# Patient Record
Sex: Male | Born: 1955 | Race: Black or African American | Hispanic: No | Smoking: Current every day smoker
Health system: Southern US, Community
[De-identification: ages and names within clinical notes are randomized; demographics above are authoritative.]

## PROBLEM LIST (undated history)

## (undated) HISTORY — PX: COLON SURGERY: SHX602

## (undated) HISTORY — PX: CHOLECYSTECTOMY: SHX55

---

## 2014-09-17 ENCOUNTER — Encounter (HOSPITAL_COMMUNITY): Payer: Self-pay | Admitting: Emergency Medicine

## 2014-09-17 ENCOUNTER — Emergency Department (HOSPITAL_COMMUNITY)
Admission: EM | Admit: 2014-09-17 | Discharge: 2014-09-17 | Disposition: A | Discharge status: Home / Self Care | Payer: Self-pay | Attending: Emergency Medicine | Admitting: Emergency Medicine

## 2014-09-17 DIAGNOSIS — Z720 Tobacco use: Secondary | ICD-10-CM | POA: Insufficient documentation

## 2014-09-17 DIAGNOSIS — Y990 Civilian activity done for income or pay: Secondary | ICD-10-CM | POA: Insufficient documentation

## 2014-09-17 DIAGNOSIS — Z23 Encounter for immunization: Secondary | ICD-10-CM | POA: Insufficient documentation

## 2014-09-17 DIAGNOSIS — Y9389 Activity, other specified: Secondary | ICD-10-CM | POA: Insufficient documentation

## 2014-09-17 DIAGNOSIS — S61412A Laceration without foreign body of left hand, initial encounter: Secondary | ICD-10-CM | POA: Insufficient documentation

## 2014-09-17 DIAGNOSIS — W260XXA Contact with knife, initial encounter: Secondary | ICD-10-CM | POA: Insufficient documentation

## 2014-09-17 DIAGNOSIS — IMO0002 Reserved for concepts with insufficient information to code with codable children: Secondary | ICD-10-CM

## 2014-09-17 DIAGNOSIS — Y9289 Other specified places as the place of occurrence of the external cause: Secondary | ICD-10-CM | POA: Insufficient documentation

## 2014-09-17 MED ORDER — LIDOCAINE HCL 2 % IJ SOLN: 10.0000 mL | Freq: Once | INTRAMUSCULAR | Status: DC

## 2014-09-17 MED ORDER — BACITRACIN 500 UNIT/GM EX OINT
1.0000 "application " | TOPICAL_OINTMENT | Freq: Two times a day (BID) | CUTANEOUS | Status: DC
  Administered 2014-09-17: 1 via TOPICAL
  Filled 2014-09-17 (×2): qty 0.9

## 2014-09-17 MED ORDER — CEPHALEXIN 500 MG PO CAPS: 500.0000 mg | ORAL_CAPSULE | Freq: Four times a day (QID) | ORAL | Status: DC

## 2014-09-17 MED ORDER — LIDOCAINE-EPINEPHRINE (PF) 2 %-1:200000 IJ SOLN
10.0000 mL | Freq: Once | INTRAMUSCULAR | Status: AC
  Administered 2014-09-17: 10 mL
  Filled 2014-09-17: qty 20

## 2014-09-17 MED ORDER — BACITRACIN ZINC 500 UNIT/GM EX OINT: TOPICAL_OINTMENT | Freq: Two times a day (BID) | CUTANEOUS | Status: DC

## 2014-09-17 MED ORDER — TETANUS-DIPHTH-ACELL PERTUSSIS 5-2.5-18.5 LF-MCG/0.5 IM SUSP
0.5000 mL | Freq: Once | INTRAMUSCULAR | Status: AC
  Administered 2014-09-17: 0.5 mL via INTRAMUSCULAR
  Filled 2014-09-17: qty 0.5

## 2014-09-17 NOTE — ED Provider Notes (Signed)
CSN: [id number]     Arrival date & time 09/17/14  0915 History  This chart was scribed for non-physician practitioner, Joycie Peek, PA-C, working with Zadie Rhine, MD by Charline Bills, ED Scribe. This patient was seen in room TR06C/TR06C and the patient's care was started at 9:33 AM.   Chief Complaint  Patient presents with  . Extremity Laceration   The history is provided by the patient. No language interpreter was used.   HPI Comments: Stephen Herman is a 59 y.o. male, with no pertinent medical history, who presents to the Emergency Department complaining of a laceration to the palm of his L hand sustained last night while working. Pt states that he was cutting cabbage around 5 PM with a knife when unintentionally he cut his hand. He states that he washed his hand and applied a Band-Aid and glove following the accident to continue working. Pt currently rates his pain 5/10, worse with movement. He denies fever, numbness, decreased ROM. Pt thinks that his last tetanus was 3-5 years ago but he is unsure. No anticoagulation. Bleeding is controlled at this time. No other aggravating or modifying factors.  History reviewed. No pertinent past medical history. Past Surgical History  Procedure Laterality Date  . Cholecystectomy    . Colon surgery     No family history on file. History  Substance Use Topics  . Smoking status: Current Every Day Smoker -- 0.50 packs/day    Types: Cigarettes  . Smokeless tobacco: Not on file  . Alcohol Use: No    Review of Systems  Constitutional: Negative for fever.  Skin: Positive for wound.  Neurological: Negative for numbness.  Hematological: Does not bruise/bleed easily.   Allergies  Biaxin and Talwin  Home Medications   Prior to Admission medications   Medication Sig Start Date End Date Taking? Authorizing Provider  cephALEXin (KEFLEX) 500 MG capsule Take 1 capsule (500 mg total) by mouth 4 (four) times daily. 09/17/14   Rafeef Lau,  PA-C   BP 160/77 mmHg  Pulse 75  Temp(Src) 97.9 F (36.6 C) (Oral)  Resp 18  SpO2 100% Physical Exam  Constitutional: He is oriented to person, place, and time. He appears well-developed and well-nourished. No distress.  HENT:  Head: Normocephalic and atraumatic.  Eyes: Conjunctivae and EOM are normal.  Neck: Neck supple. No tracheal deviation present.  Cardiovascular: Normal rate, regular rhythm and normal heart sounds.   Pulmonary/Chest: Effort normal. No respiratory distress.  Musculoskeletal: Normal range of motion.  Neurological: He is alert and oriented to person, place, and time.  Skin: Skin is warm and dry. Laceration noted.  Palmar aspect of L hand 1.5 cm superficial laceration to inferior aspect of L thenar eminence. Maintains full active ROM of all digits. Hematosis achieved. NVI.   Psychiatric: He has a normal mood and affect. His behavior is normal.  Nursing note and vitals reviewed.  ED Course  Procedures (including critical care time) DIAGNOSTIC STUDIES: Oxygen Saturation is 100% on RA, normal by my interpretation.    COORDINATION OF CARE: 9:41 AM-Discussed treatment plan which includes sutures and tetanus with pt at bedside and pt agreed to plan.   LACERATION REPAIR PROCEDURE NOTE The patient's identification was confirmed and consent was obtained. This procedure was performed by Joycie Peek, PA-C at 10:10 AM. Site: L palm Sterile procedures observed Anesthetic used (type and amt): 1 mL lidocaine with epinephrine Suture type/size: 4 prolene Length: 1.5 cm # of Sutures: 2 Technique: simple interrupted  Complexity: complex Antibx  ointment applied Tetanus ordered Site anesthetized, irrigated with NS, explored without evidence of foreign body, wound well approximated, site covered with dry, sterile dressing.  Patient tolerated procedure well without complications. Instructions for care discussed verbally and patient provided with additional written  instructions for homecare and f/u.  Labs Review Labs Reviewed - No data to display  Imaging Review No results found.   EKG Interpretation None     Meds given in ED:  Medications  bacitracin ointment 1 application (1 application Topical Given 09/17/14 1024)  Tdap (BOOSTRIX) injection 0.5 mL (0.5 mLs Intramuscular Given 09/17/14 0958)  lidocaine-EPINEPHrine (XYLOCAINE W/EPI) 2 %-1:200000 (PF) injection 10 mL (10 mLs Infiltration Given by Other 09/17/14 0955)    New Prescriptions   CEPHALEXIN (KEFLEX) 500 MG CAPSULE    Take 1 capsule (500 mg total) by mouth 4 (four) times daily.   Filed Vitals:   09/17/14 0926  BP: 160/77  Pulse: 75  Temp: 97.9 F (36.6 C)  TempSrc: Oral  Resp: 18  SpO2: 100%    MDM  Vitals stable - WNL -afebrile Pt resting comfortably in ED. tetanus updated in ED PE--small, 1.5 cm laceration to palmar aspect of left hand over the thenar eminence. Neurovascularly intact. Full active range of motion without discomfort.  DDX--laceration that occurred yesterday afternoon at approximately 5 PM. Laceration was immediately washed out by patient and appears to be a clean wound with no evidence of acute infection. Patient without other significant comorbidities and I feel it is appropriate at this time to close wound by primary intention. Laceration repair by myself at bedside. After copious irrigation, there is not appear to be any structural damage to tendons or tendon sheaths. Patient will be given anti-inflammatories as well as antibiotics due to patient age and length of time since closure and instructions to return to ED in one week to have sutures removed.  I discussed all relevant lab findings and imaging results with pt and they verbalized understanding. Discussed f/u with PCP within 48 hrs and return precautions, pt very amenable to plan. Patient stable, in good condition and is appropriate for discharge.  Final diagnoses:  Laceration   I personally  performed the services described in this documentation, which was scribed in my presence. The recorded information has been reviewed and is accurate.    Joycie PeekBenjamin Samaira Holzworth, PA-C 09/17/14 1027  Zadie Rhineonald Wickline, MD 09/17/14 1600

## 2014-09-17 NOTE — Discharge Instructions (Signed)
Laceration Care, Adult °A laceration is a cut or lesion that goes through all layers of the skin and into the tissue just beneath the skin. °TREATMENT  °Some lacerations may not require closure. Some lacerations may not be able to be closed due to an increased risk of infection. It is important to see your caregiver as soon as possible after an injury to minimize the risk of infection and maximize the opportunity for successful closure. °If closure is appropriate, pain medicines may be given, if needed. The wound will be cleaned to help prevent infection. Your caregiver will use stitches (sutures), staples, wound glue (adhesive), or skin adhesive strips to repair the laceration. These tools bring the skin edges together to allow for faster healing and a better cosmetic outcome. However, all wounds will heal with a scar. Once the wound has healed, scarring can be minimized by covering the wound with sunscreen during the day for 1 full year. °HOME CARE INSTRUCTIONS  °For sutures or staples: °· Keep the wound clean and dry. °· If you were given a bandage (dressing), you should change it at least once a day. Also, change the dressing if it becomes wet or dirty, or as directed by your caregiver. °· Wash the wound with soap and water 2 times a day. Rinse the wound off with water to remove all soap. Pat the wound dry with a clean towel. °· After cleaning, apply a thin layer of the antibiotic ointment as recommended by your caregiver. This will help prevent infection and keep the dressing from sticking. °· You may shower as usual after the first 24 hours. Do not soak the wound in water until the sutures are removed. °· Only take over-the-counter or prescription medicines for pain, discomfort, or fever as directed by your caregiver. °· Get your sutures or staples removed as directed by your caregiver. °For skin adhesive strips: °· Keep the wound clean and dry. °· Do not get the skin adhesive strips wet. You may bathe  carefully, using caution to keep the wound dry. °· If the wound gets wet, pat it dry with a clean towel. °· Skin adhesive strips will fall off on their own. You may trim the strips as the wound heals. Do not remove skin adhesive strips that are still stuck to the wound. They will fall off in time. °For wound adhesive: °· You may briefly wet your wound in the shower or bath. Do not soak or scrub the wound. Do not swim. Avoid periods of heavy perspiration until the skin adhesive has fallen off on its own. After showering or bathing, gently pat the wound dry with a clean towel. °· Do not apply liquid medicine, cream medicine, or ointment medicine to your wound while the skin adhesive is in place. This may loosen the film before your wound is healed. °· If a dressing is placed over the wound, be careful not to apply tape directly over the skin adhesive. This may cause the adhesive to be pulled off before the wound is healed. °· Avoid prolonged exposure to sunlight or tanning lamps while the skin adhesive is in place. Exposure to ultraviolet light in the first year will darken the scar. °· The skin adhesive will usually remain in place for 5 to 10 days, then naturally fall off the skin. Do not pick at the adhesive film. °You may need a tetanus shot if: °· You cannot remember when you had your last tetanus shot. °· You have never had a tetanus   shot. If you get a tetanus shot, your arm may swell, get red, and feel warm to the touch. This is common and not a problem. If you need a tetanus shot and you choose not to have one, there is a rare chance of getting tetanus. Sickness from tetanus can be serious. SEEK MEDICAL CARE IF:   You have redness, swelling, or increasing pain in the wound.  You see a red line that goes away from the wound.  You have yellowish-white fluid (pus) coming from the wound.  You have a fever.  You notice a bad smell coming from the wound or dressing.  Your wound breaks open before or  after sutures have been removed.  You notice something coming out of the wound such as wood or glass.  Your wound is on your hand or foot and you cannot move a finger or toe. SEEK IMMEDIATE MEDICAL CARE IF:   Your pain is not controlled with prescribed medicine.  You have severe swelling around the wound causing pain and numbness or a change in color in your arm, hand, leg, or foot.  Your wound splits open and starts bleeding.  You have worsening numbness, weakness, or loss of function of any joint around or beyond the wound.  You develop painful lumps near the wound or on the skin anywhere on your body. MAKE SURE YOU:   Understand these instructions.  Will watch your condition.  Will get help right away if you are not doing well or get worse. Document Released: 06/08/2005 Document Revised: 08/31/2011 Document Reviewed: 12/02/2010 [facility] Patient Information 2015 [employer] [place]. This information is not intended to replace advice given to you by your health care provider. Make sure you discuss any questions you have with your health care provider.   Please return to ED in 7 days for suture removal. Return sooner if you begin to x-rays fevers, redness or swelling around her hand, extreme pain. He will also need to follow-up with your doctor or [facility] in order to establish care and for further evaluation and management of your symptoms.

## 2014-09-17 NOTE — ED Notes (Signed)
Patient states yesterday at 500pm was cutting cabbage and he cut below his thumb on the L hand.   Patient states has full ROM with the hand, denies numbness or loss of sensation in L hand.

## 2014-09-24 ENCOUNTER — Emergency Department (HOSPITAL_COMMUNITY)
Admission: EM | Admit: 2014-09-24 | Discharge: 2014-09-24 | Disposition: A | Discharge status: Home / Self Care | Payer: Self-pay | Attending: Emergency Medicine | Admitting: Emergency Medicine

## 2014-09-24 ENCOUNTER — Encounter (HOSPITAL_COMMUNITY): Payer: Self-pay | Admitting: Emergency Medicine

## 2014-09-24 DIAGNOSIS — Z720 Tobacco use: Secondary | ICD-10-CM | POA: Insufficient documentation

## 2014-09-24 DIAGNOSIS — Z792 Long term (current) use of antibiotics: Secondary | ICD-10-CM | POA: Insufficient documentation

## 2014-09-24 DIAGNOSIS — Z4802 Encounter for removal of sutures: Secondary | ICD-10-CM | POA: Insufficient documentation

## 2014-09-24 NOTE — ED Provider Notes (Signed)
CSN: [id number]     Arrival date & time 09/24/14  1304 History   First MD Initiated Contact with Patient 09/24/14 1358     Chief Complaint  Patient presents with  . Suture / Staple Removal     (Consider location/radiation/quality/duration/timing/severity/associated sxs/prior Treatment) Patient is a 59 y.o. male presenting with suture removal. The history is provided by the patient. No language interpreter was used.  Suture / Staple Removal This is a new problem. The current episode started today. The problem occurs constantly. The problem has been gradually worsening. Pertinent negatives include no fever. Nothing aggravates the symptoms. He has tried nothing for the symptoms. The treatment provided no relief.  Pt is here for suture removal  No complaints  History reviewed. No pertinent past medical history. Past Surgical History  Procedure Laterality Date  . Cholecystectomy    . Colon surgery     No family history on file. History  Substance Use Topics  . Smoking status: Current Every Day Smoker -- 0.50 packs/day    Types: Cigarettes  . Smokeless tobacco: Not on file  . Alcohol Use: No    Review of Systems  Constitutional: Negative for fever.  All other systems reviewed and are negative.     Allergies  Biaxin and Talwin  Home Medications   Prior to Admission medications   Medication Sig Start Date End Date Taking? Authorizing Provider  cephALEXin (KEFLEX) 500 MG capsule Take 1 capsule (500 mg total) by mouth 4 (four) times daily. 09/17/14   Joycie PeekBenjamin Cartner, PA-C   BP 148/82 mmHg  Pulse 93  Temp(Src) 98.2 F (36.8 C) (Oral)  Resp 18  SpO2 97% Physical Exam  Constitutional: He is oriented to person, place, and time. He appears well-developed and well-nourished.  Musculoskeletal: Normal range of motion. He exhibits no tenderness.  Healed laceration palm  No gapping  Neurological: He is alert and oriented to person, place, and time. He has normal reflexes.  Skin:  Skin is warm.    ED Course  Procedures (including critical care time) Labs Review Labs Reviewed - No data to display  Imaging Review No results found.   EKG Interpretation None      MDM   Final diagnoses:  Visit for suture removal       Elson AreasLeslie K Sofia, PA-C 09/24/14 2218  Margarita Grizzleanielle Ray, MD 09/25/14 1430

## 2014-09-24 NOTE — ED Notes (Signed)
Pt here for suture removal left hand from 3/28. Sutures intact. OK'd by PA to remove sutures.

## 2014-09-24 NOTE — Discharge Instructions (Signed)

## 2017-04-05 ENCOUNTER — Emergency Department (HOSPITAL_COMMUNITY): Payer: Self-pay

## 2017-04-05 ENCOUNTER — Encounter (HOSPITAL_COMMUNITY): Payer: Self-pay | Admitting: *Deleted

## 2017-04-05 ENCOUNTER — Emergency Department (HOSPITAL_COMMUNITY)
Admission: EM | Admit: 2017-04-05 | Discharge: 2017-04-05 | Disposition: A | Discharge status: Home / Self Care | Payer: Self-pay | Attending: Emergency Medicine | Admitting: Emergency Medicine

## 2017-04-05 DIAGNOSIS — F17210 Nicotine dependence, cigarettes, uncomplicated: Secondary | ICD-10-CM | POA: Insufficient documentation

## 2017-04-05 DIAGNOSIS — Y939 Activity, unspecified: Secondary | ICD-10-CM | POA: Insufficient documentation

## 2017-04-05 DIAGNOSIS — W228XXA Striking against or struck by other objects, initial encounter: Secondary | ICD-10-CM | POA: Insufficient documentation

## 2017-04-05 DIAGNOSIS — S92425A Nondisplaced fracture of distal phalanx of left great toe, initial encounter for closed fracture: Secondary | ICD-10-CM | POA: Insufficient documentation

## 2017-04-05 DIAGNOSIS — M79672 Pain in left foot: Secondary | ICD-10-CM

## 2017-04-05 DIAGNOSIS — Z79899 Other long term (current) drug therapy: Secondary | ICD-10-CM | POA: Insufficient documentation

## 2017-04-05 DIAGNOSIS — M5412 Radiculopathy, cervical region: Secondary | ICD-10-CM | POA: Insufficient documentation

## 2017-04-05 DIAGNOSIS — Y929 Unspecified place or not applicable: Secondary | ICD-10-CM | POA: Insufficient documentation

## 2017-04-05 DIAGNOSIS — Y999 Unspecified external cause status: Secondary | ICD-10-CM | POA: Insufficient documentation

## 2017-04-05 MED ORDER — IBUPROFEN 600 MG PO TABS: 600.0000 mg | ORAL_TABLET | Freq: Four times a day (QID) | ORAL | 0 refills | Status: DC | PRN

## 2017-04-05 MED ORDER — IBUPROFEN 400 MG PO TABS
600.0000 mg | ORAL_TABLET | Freq: Once | ORAL | Status: AC
  Administered 2017-04-05: 600 mg via ORAL
  Filled 2017-04-05: qty 1

## 2017-04-05 NOTE — ED Notes (Signed)
Patient currently in xray ?

## 2017-04-05 NOTE — Discharge Instructions (Signed)
Ibuprofen for pain, use postop shoe to support, toe fracture will likely heal on its own, if pain is not improving please follow up with orthopedics. Right arm numbness is likely due to a cervical radiculopathy, this appears to be a chronic problem, please follow-up with Dr. Lovell Sheehan with spine surgery.

## 2017-04-05 NOTE — ED Provider Notes (Signed)
[facility] EMERGENCY DEPARTMENT Provider Note   CSN: [id number] Arrival date & time: 04/05/17  1350     History   Chief Complaint Chief Complaint  Patient presents with  . Foot Pain    HPI  Keyion Knack is a 61 y.o. Male who presents with left foot pain since yesterday.  Patient reports on Saturday a door was pushed over his toes since then he's had pain primarily in his third and fourth toe of the left foot, he also has some tenderness on the left great toe. Patient describes pain as a dull constant ache. Patient reports at first he thought there were fine, but started to have pain while walking in his boots at work. Patient reports some swelling over toes, no numbness or tingling. He is able to bear weight with some discomfort. Patient reports skin is intact, no bleeding, abrasions or lacerations. He is not taken anything for the pain prior to arrival.  Patient also reports 1 year of numbness that started in the right fingertips and has progressed up the right arm, he reports a doctor in prison mentioned that he may have had some disc problems or a pinched nerve in his neck, he has not followed up about this since. Patient reports his grip feels slightly weaker in that hand, but he still able to move the arm, and use hand. Patient reports some right-sided neck pain. No left-sided numbness or weakness. No acute change or worsening of the symptoms. Patient denies any chest pain or shortness of breath.       History reviewed. No pertinent past medical history.  There are no active problems to display for this patient.   Past Surgical History:  Procedure Laterality Date  . CHOLECYSTECTOMY    . COLON SURGERY         Home Medications    Prior to Admission medications   Medication Sig Start Date End Date Taking? Authorizing Provider  cephALEXin (KEFLEX) 500 MG capsule Take 1 capsule (500 mg total) by mouth 4 (four) times daily. 09/17/14   Cartner,  Sharlet Salina, PA-C  ibuprofen (ADVIL,MOTRIN) 600 MG tablet Take 1 tablet (600 mg total) by mouth every 6 (six) hours as needed. 04/05/17   Dartha Lodge, PA-C    Family History No family history on file.  Social History Social History  Substance Use Topics  . Smoking status: Current Every Day Smoker    Packs/day: 0.50    Types: Cigarettes  . Smokeless tobacco: Never Used  . Alcohol use No     Allergies   Biaxin [clarithromycin] and Talwin [pentazocine]   Review of Systems Review of Systems  Constitutional: Negative for chills and fever.  Respiratory: Negative for cough, chest tightness and shortness of breath.   Cardiovascular: Negative for chest pain.  Gastrointestinal: Negative for abdominal pain.  Musculoskeletal: Positive for arthralgias. Negative for gait problem.       Left foot pain  Skin: Negative for wound.  Neurological: Positive for numbness (Right arm). Negative for facial asymmetry, speech difficulty and weakness.     Physical Exam Updated Vital Signs BP (!) 140/99 (BP Location: Right Arm)   Pulse 95   Temp 98.5 F (36.9 C) (Oral)   Resp 14   Ht  (1.778 m)   Wt 102.1 kg (225 lb)   SpO2 98%   BMI 32.28 kg/m   Physical Exam  Constitutional: He appears well-developed and well-nourished. No distress.  HENT:  Head: Normocephalic and atraumatic.  Eyes: Right eye exhibits no discharge. Left eye exhibits no discharge.  Pulmonary/Chest: Effort normal. No respiratory distress.  Musculoskeletal: He exhibits tenderness.  tenderness to palpation over the third and fourth toe of the left foot, some mild ecchymosis noted to both toes, patient able to wiggle toes without issue, some tenderness to palpation over the interphalangeal joint of the left great toe, no discoloration. No tenderness over the metatarsals, ankle or calcaneus. Distal pulses 2+ and good capillary refill, sensation intact, patient able to dorsiflex and plantarflex against resistance without  difficulty. Patient able to perform full range of motion of right arm with no limitation, patient does have some pain on palpation of the right lateral neck, no midline tenderness of the C-spine. Pt reports decreased sensation in the right arm when compared to the left. Grip Strength slightly weaker on right than left. Spurling maneuver on right side produces tingling sensation and right arm.  Neurological: He is alert. Coordination normal.  Skin: Skin is warm and dry. Capillary refill takes less than 2 seconds. No rash noted. He is not diaphoretic.  Psychiatric: He has a normal mood and affect. His behavior is normal.  Nursing note and vitals reviewed.    ED Treatments / Results  Labs (all labs ordered are listed, but only abnormal results are displayed) Labs Reviewed - No data to display  EKG  EKG Interpretation None       Radiology Dg Foot Complete Left  Result Date: 04/05/2017 CLINICAL DATA:  61 y/o M; left hip pain after injury. Pain predominantly over the third and fourth digits. EXAM: LEFT FOOT - COMPLETE 3+ VIEW COMPARISON:  None. FINDINGS: Age-indeterminate fracture of the medial base of first proximal phalanx, the margins appear corticated and this may be chronic. No joint dislocation. Lisfranc alignment is maintained. Small plantar calcaneal bone spur. IMPRESSION: Age-indeterminate fracture of medial base of first proximal phalanx. Correlate for focal tenderness. No other fracture identified. Electronically Signed   By: Mitzi Hansen M.D.   On: 04/05/2017 14:42    Procedures Procedures (including critical care time)  Medications Ordered in ED Medications  ibuprofen (ADVIL,MOTRIN) tablet 600 mg (600 mg Oral Given 04/05/17 1546)     Initial Impression / Assessment and Plan / ED Course  I have reviewed the triage vital signs and the nursing notes.  Pertinent labs & imaging results that were available during my care of the patient were reviewed by me and  considered in my medical decision making (see chart for details).  Patient presents with left foot pain, after a door was pulled over his toes. Mild ecchymosis, and tenderness over the third and fourth toes left foot. Some tenderness to palpation on the medial aspect of the great toe, patient able to bear weight with mild discomfort, no gait abnormality. Left foot x-ray shows possible fracture of the medial base of the first proximal phalanx. Patient provided with postop shoe pain treated in the ED with ibuprofen. Fracture will likely heal on its own, Patient to follow-up with orthopedics. Numbness in right arm likely due to cervical radiculopathy, symptoms have been present for greater than 1 year, no left-sided symptoms to suggest cord impingement, this can be followed up outpatient with Dr. Lovell Sheehan with spine surgery. Return precautions provided, Pt expresses understanding and agrees with plan.   Final Clinical Impressions(s) / ED Diagnoses   Final diagnoses:  Cervical radiculopathy  Closed nondisplaced fracture of distal phalanx of left great toe, initial encounter  Foot pain, left  New Prescriptions New Prescriptions   IBUPROFEN (ADVIL,MOTRIN) 600 MG TABLET    Take 1 tablet (600 mg total) by mouth every 6 (six) hours as needed.     Dartha Lodge, PA-C 04/06/17 0830    Raeford Razor, MD 04/06/17 1150

## 2017-04-05 NOTE — ED Triage Notes (Signed)
Pt c/o L foot pain since it got hit with a door yesterday, pt ambulatory, small amt of swelling noted, skin intact, pt reports numbness to R hand for over a year, A&O x4, denies CP & SOB

## 2017-04-05 NOTE — ED Notes (Signed)
Patient transported to X-ray 

## 2017-07-24 ENCOUNTER — Emergency Department (HOSPITAL_COMMUNITY)
Admission: EM | Admit: 2017-07-24 | Discharge: 2017-07-25 | Disposition: A | Discharge status: Home / Self Care | Payer: Self-pay | Attending: Emergency Medicine | Admitting: Emergency Medicine

## 2017-07-24 ENCOUNTER — Emergency Department (HOSPITAL_COMMUNITY): Payer: Self-pay

## 2017-07-24 ENCOUNTER — Encounter (HOSPITAL_COMMUNITY): Payer: Self-pay | Admitting: Emergency Medicine

## 2017-07-24 DIAGNOSIS — W11XXXA Fall on and from ladder, initial encounter: Secondary | ICD-10-CM | POA: Insufficient documentation

## 2017-07-24 DIAGNOSIS — Y939 Activity, unspecified: Secondary | ICD-10-CM | POA: Insufficient documentation

## 2017-07-24 DIAGNOSIS — M25561 Pain in right knee: Secondary | ICD-10-CM | POA: Insufficient documentation

## 2017-07-24 DIAGNOSIS — Y999 Unspecified external cause status: Secondary | ICD-10-CM | POA: Insufficient documentation

## 2017-07-24 DIAGNOSIS — F17210 Nicotine dependence, cigarettes, uncomplicated: Secondary | ICD-10-CM | POA: Insufficient documentation

## 2017-07-24 DIAGNOSIS — J40 Bronchitis, not specified as acute or chronic: Secondary | ICD-10-CM | POA: Insufficient documentation

## 2017-07-24 DIAGNOSIS — Y929 Unspecified place or not applicable: Secondary | ICD-10-CM | POA: Insufficient documentation

## 2017-07-24 LAB — CBC WITH DIFFERENTIAL/PLATELET
Basophils Absolute: 0 10*3/uL (ref 0.0–0.1)
Basophils Relative: 0 %
Eosinophils Absolute: 0 10*3/uL (ref 0.0–0.7)
Eosinophils Relative: 0 %
HCT: 40.8 % (ref 39.0–52.0)
Hemoglobin: 14.1 g/dL (ref 13.0–17.0)
Lymphocytes Relative: 6 %
Lymphs Abs: 1 10*3/uL (ref 0.7–4.0)
MCH: 32.9 pg (ref 26.0–34.0)
MCHC: 34.6 g/dL (ref 30.0–36.0)
MCV: 95.1 fL (ref 78.0–100.0)
Monocytes Absolute: 1 10*3/uL (ref 0.1–1.0)
Monocytes Relative: 6 %
Neutro Abs: 13.9 10*3/uL — ABNORMAL HIGH (ref 1.7–7.7)
Neutrophils Relative %: 88 %
Platelets: 236 10*3/uL (ref 150–400)
RBC: 4.29 MIL/uL (ref 4.22–5.81)
RDW: 13.3 % (ref 11.5–15.5)
WBC: 15.9 10*3/uL — ABNORMAL HIGH (ref 4.0–10.5)

## 2017-07-24 LAB — BASIC METABOLIC PANEL
Anion gap: 11 (ref 5–15)
BUN: 8 mg/dL (ref 6–20)
CO2: 23 mmol/L (ref 22–32)
Calcium: 9.2 mg/dL (ref 8.9–10.3)
Chloride: 105 mmol/L (ref 101–111)
Creatinine, Ser: 0.85 mg/dL (ref 0.61–1.24)
GFR calc Af Amer: >60 mL/min (ref >60)
GFR calc non Af Amer: >60 mL/min (ref >60)
Glucose, Bld: 111 mg/dL — ABNORMAL HIGH (ref 65–99)
Potassium: 3.6 mmol/L (ref 3.5–5.1)
Sodium: 139 mmol/L (ref 135–145)

## 2017-07-24 LAB — URINALYSIS, ROUTINE W REFLEX MICROSCOPIC
Bilirubin Urine: NEGATIVE
Glucose, UA: NEGATIVE mg/dL
Hgb urine dipstick: NEGATIVE
Ketones, ur: NEGATIVE mg/dL
Leukocytes, UA: NEGATIVE
Nitrite: NEGATIVE
Protein, ur: NEGATIVE mg/dL
Specific Gravity, Urine: 1.014 (ref 1.005–1.030)
pH: 5 (ref 5.0–8.0)

## 2017-07-24 MED ORDER — MORPHINE SULFATE (PF) 4 MG/ML IV SOLN
4.0000 mg | Freq: Once | INTRAVENOUS | Status: AC
  Administered 2017-07-24: 4 mg via INTRAVENOUS
  Filled 2017-07-24: qty 1

## 2017-07-24 MED ORDER — SODIUM CHLORIDE 0.9 % IV BOLUS (SEPSIS)
500.0000 mL | Freq: Once | INTRAVENOUS | Status: AC
  Administered 2017-07-24: 500 mL via INTRAVENOUS

## 2017-07-24 MED ORDER — IBUPROFEN 800 MG PO TABS
800.0000 mg | ORAL_TABLET | Freq: Once | ORAL | Status: AC
  Administered 2017-07-24: 800 mg via ORAL
  Filled 2017-07-24: qty 1

## 2017-07-24 MED ORDER — CYCLOBENZAPRINE HCL 10 MG PO TABS
10.0000 mg | ORAL_TABLET | Freq: Once | ORAL | Status: AC
  Administered 2017-07-24: 10 mg via ORAL
  Filled 2017-07-24: qty 1

## 2017-07-24 MED ORDER — MORPHINE SULFATE (PF) 4 MG/ML IV SOLN
4.0000 mg | Freq: Once | INTRAVENOUS | Status: AC
Start: 2017-07-24 — End: 2017-07-24
  Administered 2017-07-24: 4 mg via INTRAVENOUS
  Filled 2017-07-24: qty 1

## 2017-07-24 MED ORDER — OXYCODONE-ACETAMINOPHEN 5-325 MG PO TABS
1.0000 | ORAL_TABLET | Freq: Once | ORAL | Status: AC
  Administered 2017-07-24: 1 via ORAL
  Filled 2017-07-24: qty 1
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
## 2017-07-24 NOTE — ED Notes (Signed)
Patient transported to X-ray 

## 2017-07-25 MED ORDER — MORPHINE SULFATE (PF) 4 MG/ML IV SOLN
4.0000 mg | Freq: Once | INTRAVENOUS | Status: AC
  Administered 2017-07-25: 4 mg via INTRAVENOUS
  Filled 2017-07-25: qty 1

## 2017-07-25 MED ORDER — OXYCODONE HCL 5 MG PO TABS: 5.0000 mg | ORAL_TABLET | Freq: Four times a day (QID) | ORAL | 0 refills | Status: DC | PRN
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
## 2017-07-25 NOTE — Discharge Instructions (Addendum)
Alternate 600 mg of ibuprofen and [phone] mg of Tylenol every 3 hours as needed for pain. Do not exceed 4000 mg of Tylenol daily.  You may take oxycodone for severe pain but do not drive, drink alcohol, or operate heavy machinery while on this medicine.  Apply ice for comfort.  Elevate the extremity when you are not walking on it.  Use the knee immobilizer and crutches to get around.  Drink plenty of fluids and get plenty of rest.  Follow-up with Dr. August Saucerean for reevaluation of your symptoms.  Return to the emergency department if any concerning signs or symptoms develop such as fever, weakness, or loss of pulses.

## 2017-07-25 NOTE — ED Notes (Signed)
Patient transported to MRI 

## 2017-07-25 NOTE — ED Notes (Signed)
Ortho tech at bedside 

## 2017-07-25 NOTE — ED Notes (Signed)
Pt able to get in touch with family who will can pick him up
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
## 2017-07-26 ENCOUNTER — Telehealth (INDEPENDENT_AMBULATORY_CARE_PROVIDER_SITE_OTHER): Payer: Self-pay

## 2017-07-26 NOTE — Telephone Encounter (Signed)
IC patient per Dr August Saucerean to follow up with patient about getting them worked in today for knee. No answer. LMVM for him to call back to get appt.

## 2017-11-20 ENCOUNTER — Emergency Department (HOSPITAL_COMMUNITY): Payer: Self-pay

## 2017-11-20 ENCOUNTER — Emergency Department (HOSPITAL_COMMUNITY)
Admission: EM | Admit: 2017-11-20 | Discharge: 2017-11-21 | Disposition: A | Discharge status: Home / Self Care | Payer: Self-pay | Attending: Emergency Medicine | Admitting: Emergency Medicine

## 2017-11-20 DIAGNOSIS — S9002XA Contusion of left ankle, initial encounter: Secondary | ICD-10-CM | POA: Insufficient documentation

## 2017-11-20 DIAGNOSIS — S0990XA Unspecified injury of head, initial encounter: Secondary | ICD-10-CM | POA: Insufficient documentation

## 2017-11-20 DIAGNOSIS — S299XXA Unspecified injury of thorax, initial encounter: Secondary | ICD-10-CM | POA: Insufficient documentation

## 2017-11-20 DIAGNOSIS — S7011XA Contusion of right thigh, initial encounter: Secondary | ICD-10-CM | POA: Insufficient documentation

## 2017-11-20 DIAGNOSIS — S3991XA Unspecified injury of abdomen, initial encounter: Secondary | ICD-10-CM | POA: Insufficient documentation

## 2017-11-20 DIAGNOSIS — Y999 Unspecified external cause status: Secondary | ICD-10-CM | POA: Insufficient documentation

## 2017-11-20 DIAGNOSIS — S61411A Laceration without foreign body of right hand, initial encounter: Secondary | ICD-10-CM | POA: Insufficient documentation

## 2017-11-20 DIAGNOSIS — S9001XA Contusion of right ankle, initial encounter: Secondary | ICD-10-CM | POA: Insufficient documentation

## 2017-11-20 DIAGNOSIS — Y92410 Unspecified street and highway as the place of occurrence of the external cause: Secondary | ICD-10-CM | POA: Insufficient documentation

## 2017-11-20 DIAGNOSIS — T1490XA Injury, unspecified, initial encounter: Secondary | ICD-10-CM

## 2017-11-20 DIAGNOSIS — Y9389 Activity, other specified: Secondary | ICD-10-CM | POA: Insufficient documentation

## 2017-11-20 DIAGNOSIS — R221 Localized swelling, mass and lump, neck: Secondary | ICD-10-CM | POA: Insufficient documentation

## 2017-11-20 DIAGNOSIS — E0789 Other specified disorders of thyroid: Secondary | ICD-10-CM

## 2017-11-20 DIAGNOSIS — F17200 Nicotine dependence, unspecified, uncomplicated: Secondary | ICD-10-CM | POA: Insufficient documentation

## 2017-11-20 LAB — I-STAT CG4 LACTIC ACID, ED: Lactic Acid, Venous: 1.79 mmol/L (ref 0.5–1.9)

## 2017-11-20 LAB — I-STAT CHEM 8, ED
BUN: 10 mg/dL (ref 6–20)
Calcium, Ion: 1.12 mmol/L — ABNORMAL LOW (ref 1.15–1.40)
Chloride: 108 mmol/L (ref 101–111)
Creatinine, Ser: 0.8 mg/dL (ref 0.61–1.24)
GLUCOSE: 104 mg/dL — AB (ref 65–99)
HEMATOCRIT: 38 % — AB (ref 39.0–52.0)
Hemoglobin: 12.9 g/dL — ABNORMAL LOW (ref 13.0–17.0)
POTASSIUM: 3.2 mmol/L — AB (ref 3.5–5.1)
Sodium: 143 mmol/L (ref 135–145)
TCO2: 22 mmol/L (ref 22–32)

## 2017-11-20 LAB — CBC
HCT: 41 % (ref 39.0–52.0)
Hemoglobin: 13.4 g/dL (ref 13.0–17.0)
MCH: 32.1 pg (ref 26.0–34.0)
MCHC: 32.7 g/dL (ref 30.0–36.0)
MCV: 98.3 fL (ref 78.0–100.0)
PLATELETS: 298 10*3/uL (ref 150–400)
RBC: 4.17 MIL/uL — AB (ref 4.22–5.81)
RDW: 12.4 % (ref 11.5–15.5)
WBC: 12.7 10*3/uL — ABNORMAL HIGH (ref 4.0–10.5)

## 2017-11-20 LAB — PROTIME-INR
INR: 1.11
Prothrombin Time: 14.3 seconds (ref 11.4–15.2)

## 2017-11-20 LAB — COMPREHENSIVE METABOLIC PANEL
ALK PHOS: 60 U/L (ref 38–126)
ALT: 19 U/L (ref 17–63)
AST: 22 U/L (ref 15–41)
Albumin: 3.6 g/dL (ref 3.5–5.0)
Anion gap: 9 (ref 5–15)
BUN: 9 mg/dL (ref 6–20)
CALCIUM: 9.1 mg/dL (ref 8.9–10.3)
CO2: 24 mmol/L (ref 22–32)
CREATININE: 1.02 mg/dL (ref 0.61–1.24)
Chloride: 108 mmol/L (ref 101–111)
Glucose, Bld: 113 mg/dL — ABNORMAL HIGH (ref 65–99)
Potassium: 3.8 mmol/L (ref 3.5–5.1)
SODIUM: 141 mmol/L (ref 135–145)
Total Bilirubin: 1.6 mg/dL — ABNORMAL HIGH (ref 0.3–1.2)
Total Protein: 6.9 g/dL (ref 6.5–8.1)

## 2017-11-20 LAB — SAMPLE TO BLOOD BANK

## 2017-11-20 LAB — ETHANOL

## 2017-11-20 MED ORDER — FENTANYL CITRATE (PF) 100 MCG/2ML IJ SOLN
INTRAMUSCULAR | Status: DC
Start: 2017-11-20 — End: 2017-11-21
  Filled 2017-11-20: qty 2

## 2017-11-20 MED ORDER — IOHEXOL 300 MG/ML  SOLN
100.0000 mL | Freq: Once | INTRAMUSCULAR | Status: AC | PRN
  Administered 2017-11-20: 100 mL via INTRAVENOUS

## 2017-11-20 MED ORDER — FENTANYL CITRATE (PF) 100 MCG/2ML IJ SOLN
INTRAMUSCULAR | Status: AC | PRN
  Administered 2017-11-20: 100 ug via INTRAVENOUS

## 2017-11-20 NOTE — ED Notes (Signed)
Rigid c-collar replaced by [place] collar
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
## 2017-11-21 ENCOUNTER — Encounter (HOSPITAL_COMMUNITY): Payer: Self-pay | Admitting: Emergency Medicine

## 2017-11-21 LAB — TSH: TSH: 1.157 u[IU]/mL (ref 0.350–4.500)

## 2017-11-21 LAB — T4, FREE: FREE T4: 0.81 ng/dL — AB (ref 0.82–1.77)

## 2017-11-21 LAB — CDS SEROLOGY

## 2017-11-21 MED ORDER — HYDROCODONE-ACETAMINOPHEN 7.5-325 MG/15ML PO SOLN
10.0000 mL | Freq: Once | ORAL | Status: AC
  Administered 2017-11-21: 10 mL via ORAL
  Filled 2017-11-21: qty 15

## 2017-11-21 MED ORDER — HYDROCODONE-ACETAMINOPHEN 5-325 MG PO TABS: 1.0000 | ORAL_TABLET | Freq: Four times a day (QID) | ORAL | 0 refills | Status: AC | PRN
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
## 2017-11-22 ENCOUNTER — Encounter (HOSPITAL_COMMUNITY): Payer: Self-pay | Admitting: Emergency Medicine

## 2017-11-25 ENCOUNTER — Emergency Department (HOSPITAL_COMMUNITY)
Admission: EM | Admit: 2017-11-25 | Discharge: 2017-11-25 | Disposition: A | Discharge status: Home / Self Care | Payer: No Typology Code available for payment source | Attending: Emergency Medicine | Admitting: Emergency Medicine

## 2017-11-25 ENCOUNTER — Other Ambulatory Visit: Payer: Self-pay

## 2017-11-25 ENCOUNTER — Encounter (HOSPITAL_COMMUNITY): Payer: Self-pay | Admitting: Emergency Medicine

## 2017-11-25 ENCOUNTER — Emergency Department (HOSPITAL_COMMUNITY): Payer: No Typology Code available for payment source

## 2017-11-25 DIAGNOSIS — Y929 Unspecified place or not applicable: Secondary | ICD-10-CM | POA: Insufficient documentation

## 2017-11-25 DIAGNOSIS — Y999 Unspecified external cause status: Secondary | ICD-10-CM | POA: Diagnosis not present

## 2017-11-25 DIAGNOSIS — Z79899 Other long term (current) drug therapy: Secondary | ICD-10-CM | POA: Insufficient documentation

## 2017-11-25 DIAGNOSIS — F17210 Nicotine dependence, cigarettes, uncomplicated: Secondary | ICD-10-CM | POA: Diagnosis not present

## 2017-11-25 DIAGNOSIS — L03115 Cellulitis of right lower limb: Secondary | ICD-10-CM | POA: Diagnosis not present

## 2017-11-25 DIAGNOSIS — Y939 Activity, unspecified: Secondary | ICD-10-CM | POA: Diagnosis not present

## 2017-11-25 DIAGNOSIS — S42035A Nondisplaced fracture of lateral end of left clavicle, initial encounter for closed fracture: Secondary | ICD-10-CM | POA: Diagnosis present

## 2017-11-25 MED ORDER — CEPHALEXIN 250 MG PO CAPS
500.0000 mg | ORAL_CAPSULE | Freq: Once | ORAL | Status: AC
  Administered 2017-11-25: 500 mg via ORAL
  Filled 2017-11-25: qty 2

## 2017-11-25 MED ORDER — OXYCODONE HCL 5 MG PO TABS
5.0000 mg | ORAL_TABLET | Freq: Once | ORAL | Status: AC
  Administered 2017-11-25: 5 mg via ORAL
  Filled 2017-11-25: qty 1

## 2017-11-25 MED ORDER — ACETAMINOPHEN 500 MG PO TABS
1000.0000 mg | ORAL_TABLET | Freq: Once | ORAL | Status: AC
  Administered 2017-11-25: 1000 mg via ORAL
  Filled 2017-11-25: qty 2

## 2017-11-25 MED ORDER — CEPHALEXIN 500 MG PO CAPS: 500.0000 mg | ORAL_CAPSULE | Freq: Four times a day (QID) | ORAL | 0 refills | Status: DC
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
## 2017-11-25 NOTE — Discharge Instructions (Signed)
Please read and follow all provided instructions.  Your diagnoses today include:  1. Cellulitis of right foot   2. Nondisplaced fracture of lateral end of left clavicle, initial encounter for closed fracture     Tests performed today include:  X-ray of your left shoulder shows nondisplaced clavicle fracture  Vital signs. See below for your results today.   Medications prescribed:   Keflex (cephalexin) - antibiotic  You have been prescribed an antibiotic medicine: take the entire course of medicine even if you are feeling better. Stopping early can cause the antibiotic not to work.  Take any prescribed medications only as directed.  Home care instructions:  Follow any educational materials contained in this packet.  Please follow-up with orthopedic as necessary.  Please also follow-up with the primary care referral given tonight.  Follow-up instructions: Please follow-up with your primary care provider in the next 3 days for further evaluation of your symptoms.   Return instructions:   Please return to the Emergency Department if you experience worsening symptoms.   Return return with fever, worsening redness or swelling of the foot.   Please return if you have any other emergent concerns.  Additional Information:  Your vital signs today were: BP (!) 144/71    Pulse 95    Temp 98.7 F (37.1 C) (Oral)    Resp 18    Ht 5\' 10"  (1.778 m)    Wt 104.3 kg (230 lb)    SpO2 100%    BMI 33.00 kg/m  If your blood pressure (BP) was elevated above 135/85 this visit, please have this repeated by your doctor within one month. --------------

## 2017-11-25 NOTE — ED Notes (Signed)
Called pt for vitals x3, no response. 

## 2017-11-25 NOTE — ED Notes (Signed)
Called to recheck vitals with no answer  
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
## 2017-12-13 ENCOUNTER — Emergency Department (HOSPITAL_COMMUNITY): Payer: Self-pay

## 2017-12-13 ENCOUNTER — Other Ambulatory Visit: Payer: Self-pay

## 2017-12-13 ENCOUNTER — Emergency Department (HOSPITAL_COMMUNITY)
Admission: EM | Admit: 2017-12-13 | Discharge: 2017-12-13 | Disposition: A | Discharge status: Home / Self Care | Payer: Self-pay | Attending: Physician Assistant | Admitting: Physician Assistant

## 2017-12-13 ENCOUNTER — Encounter (HOSPITAL_COMMUNITY): Payer: Self-pay | Admitting: Emergency Medicine

## 2017-12-13 ENCOUNTER — Emergency Department (HOSPITAL_BASED_OUTPATIENT_CLINIC_OR_DEPARTMENT_OTHER): Payer: Self-pay

## 2017-12-13 DIAGNOSIS — M79609 Pain in unspecified limb: Secondary | ICD-10-CM

## 2017-12-13 DIAGNOSIS — M79604 Pain in right leg: Secondary | ICD-10-CM | POA: Insufficient documentation

## 2017-12-13 DIAGNOSIS — Z79899 Other long term (current) drug therapy: Secondary | ICD-10-CM | POA: Insufficient documentation

## 2017-12-13 DIAGNOSIS — F17210 Nicotine dependence, cigarettes, uncomplicated: Secondary | ICD-10-CM | POA: Insufficient documentation

## 2017-12-13 DIAGNOSIS — M7989 Other specified soft tissue disorders: Secondary | ICD-10-CM

## 2017-12-13 LAB — CBC WITH DIFFERENTIAL/PLATELET
ABS IMMATURE GRANULOCYTES: 0 10*3/uL (ref 0.0–0.1)
BASOS ABS: 0 10*3/uL (ref 0.0–0.1)
BASOS PCT: 0 %
EOS PCT: 1 %
Eosinophils Absolute: 0.1 10*3/uL (ref 0.0–0.7)
HCT: 36 % — ABNORMAL LOW (ref 39.0–52.0)
HEMOGLOBIN: 11.5 g/dL — AB (ref 13.0–17.0)
Immature Granulocytes: 0 %
LYMPHS PCT: 22 %
Lymphs Abs: 1.8 10*3/uL (ref 0.7–4.0)
MCH: 31.9 pg (ref 26.0–34.0)
MCHC: 31.9 g/dL (ref 30.0–36.0)
MCV: 100 fL (ref 78.0–100.0)
MONO ABS: 0.6 10*3/uL (ref 0.1–1.0)
MONOS PCT: 7 %
NEUTROS ABS: 5.8 10*3/uL (ref 1.7–7.7)
Neutrophils Relative %: 70 %
Platelets: 369 10*3/uL (ref 150–400)
RBC: 3.6 MIL/uL — ABNORMAL LOW (ref 4.22–5.81)
RDW: 13.2 % (ref 11.5–15.5)
WBC: 8.4 10*3/uL (ref 4.0–10.5)

## 2017-12-13 LAB — BASIC METABOLIC PANEL
Anion gap: 5 (ref 5–15)
BUN: 8 mg/dL (ref 6–20)
CALCIUM: 9.3 mg/dL (ref 8.9–10.3)
CO2: 30 mmol/L (ref 22–32)
Chloride: 107 mmol/L (ref 101–111)
Creatinine, Ser: 0.86 mg/dL (ref 0.61–1.24)
GFR calc Af Amer: >60 mL/min (ref >60)
GLUCOSE: 105 mg/dL — AB (ref 65–99)
Potassium: 3.9 mmol/L (ref 3.5–5.1)
Sodium: 142 mmol/L (ref 135–145)

## 2017-12-13 LAB — I-STAT CG4 LACTIC ACID, ED
LACTIC ACID, VENOUS: 1.19 mmol/L (ref 0.5–1.9)
Lactic Acid, Venous: 1.16 mmol/L (ref 0.5–1.9)

## 2017-12-13 MED ORDER — IOPAMIDOL (ISOVUE-370) INJECTION 76%
INTRAVENOUS | Status: AC
  Filled 2017-12-13: qty 100

## 2017-12-13 MED ORDER — MORPHINE SULFATE (PF) 4 MG/ML IV SOLN
4.0000 mg | Freq: Once | INTRAVENOUS | Status: AC
  Administered 2017-12-13: 4 mg via INTRAVENOUS
  Filled 2017-12-13: qty 1

## 2017-12-13 MED ORDER — IOPAMIDOL (ISOVUE-370) INJECTION 76%
100.0000 mL | Freq: Once | INTRAVENOUS | Status: AC
  Administered 2017-12-13: 100 mL via INTRAVENOUS

## 2017-12-13 MED ORDER — IOPAMIDOL (ISOVUE-370) INJECTION 76%
100.0000 mL | Freq: Once | INTRAVENOUS | Status: AC | PRN
  Administered 2017-12-13: 100 mL via INTRAVENOUS

## 2017-12-13 MED ORDER — TRAMADOL HCL 50 MG PO TABS: 50.0000 mg | ORAL_TABLET | Freq: Four times a day (QID) | ORAL | 0 refills | Status: DC | PRN

## 2017-12-13 NOTE — Progress Notes (Signed)
RLE venous duplex prelim: negative for DVT.  Waldemar Siegel Eunice, RDMS, RVT  
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
## 2017-12-13 NOTE — ED Notes (Signed)
D/c reviewed with patient and spouse 

## 2017-12-13 NOTE — ED Notes (Signed)
Pt assisted to bathroom with use of 1 cruth

## 2017-12-13 NOTE — Discharge Instructions (Addendum)
Your lab work and scans were reassuring.   For pain control you may take: 800mg  of ibuprofen (that is usually four 200mg  over the counter pills) up to 3 times a day (please take with food) and acetaminophen 975mg  (this is 3 normal strength, 325mg , over the counter pills) up to four times a day. Please do not take more than this. Do not drink alcohol or combine with other medications that have acetaminophen or Ibuprofen as an ingredient (Read the labels!).    For breakthrough pain you may take Ultram. Do not drink alcohol drive or operate heavy machinery when taking. You are being provided a prescription for opiates (also known as narcotics) for pain control on an ?as needed? basis.  Opiates can be addictive and should only be used when absolutely necessary for pain control when other alternatives do not work.  We recommend you only use them for the recommended amount of time and only as prescribed.  Please do not take with other sedative medications or alcohol.  Please do not drive, operate machinery, or make important decisions while taking opiates.  Please note that these medications can be addictive and have high abuse potential.  Please keep these medications locked away from children, teenagers or any family members with history of substance abuse. Additionally, these medications may cause constipation - take over the counter stool softeners or add fiber to your diet to treat this (Metamucil, Psyllium Fiber, Colace, Miralax) Further refills will need to be obtained from your primary care doctor and will not be prescribed through the Emergency Department. You will test positive on most drug tests while taking this medication.   If you develop worsening or new concerning symptoms you can return to the emergency department for re-evaluation.

## 2017-12-13 NOTE — ED Notes (Signed)
Patient transported to CT 
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
## 2017-12-15 ENCOUNTER — Encounter (INDEPENDENT_AMBULATORY_CARE_PROVIDER_SITE_OTHER): Payer: Self-pay | Admitting: Family

## 2017-12-15 ENCOUNTER — Ambulatory Visit (INDEPENDENT_AMBULATORY_CARE_PROVIDER_SITE_OTHER): Payer: Self-pay | Admitting: Family

## 2017-12-15 DIAGNOSIS — L97311 Non-pressure chronic ulcer of right ankle limited to breakdown of skin: Secondary | ICD-10-CM

## 2017-12-15 DIAGNOSIS — R600 Localized edema: Secondary | ICD-10-CM
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
## 2017-12-22 ENCOUNTER — Ambulatory Visit (INDEPENDENT_AMBULATORY_CARE_PROVIDER_SITE_OTHER): Payer: Self-pay | Admitting: Family

## 2017-12-22 ENCOUNTER — Encounter (INDEPENDENT_AMBULATORY_CARE_PROVIDER_SITE_OTHER): Payer: Self-pay | Admitting: Family

## 2017-12-22 VITALS — Ht 70.0 in | Wt 230.0 lb

## 2017-12-22 DIAGNOSIS — M25511 Pain in right shoulder: Secondary | ICD-10-CM

## 2017-12-22 DIAGNOSIS — L98491 Non-pressure chronic ulcer of skin of other sites limited to breakdown of skin: Secondary | ICD-10-CM

## 2017-12-22 DIAGNOSIS — M25512 Pain in left shoulder: Secondary | ICD-10-CM

## 2017-12-22 DIAGNOSIS — S42022D Displaced fracture of shaft of left clavicle, subsequent encounter for fracture with routine healing: Secondary | ICD-10-CM

## 2017-12-22 DIAGNOSIS — R600 Localized edema: Secondary | ICD-10-CM

## 2017-12-22 MED ORDER — PREDNISONE 50 MG PO TABS
ORAL_TABLET | ORAL | 0 refills | Status: DC
Start: 2017-12-22 — End: 2018-01-08
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
## 2017-12-27 ENCOUNTER — Encounter (INDEPENDENT_AMBULATORY_CARE_PROVIDER_SITE_OTHER): Payer: Self-pay | Admitting: Family

## 2018-01-05 ENCOUNTER — Ambulatory Visit: Payer: Self-pay | Attending: Nurse Practitioner | Admitting: Nurse Practitioner

## 2018-01-05 ENCOUNTER — Encounter: Payer: Self-pay | Admitting: Nurse Practitioner

## 2018-01-05 VITALS — BP 150/81 | HR 91 | Temp 99.2°F | Ht 70.5 in | Wt 226.8 lb

## 2018-01-05 DIAGNOSIS — M79604 Pain in right leg: Secondary | ICD-10-CM | POA: Insufficient documentation

## 2018-01-05 DIAGNOSIS — Z79891 Long term (current) use of opiate analgesic: Secondary | ICD-10-CM | POA: Insufficient documentation

## 2018-01-05 DIAGNOSIS — Z79899 Other long term (current) drug therapy: Secondary | ICD-10-CM | POA: Insufficient documentation

## 2018-01-05 DIAGNOSIS — T148XXA Other injury of unspecified body region, initial encounter: Secondary | ICD-10-CM

## 2018-01-05 DIAGNOSIS — Z9049 Acquired absence of other specified parts of digestive tract: Secondary | ICD-10-CM | POA: Insufficient documentation

## 2018-01-05 DIAGNOSIS — M791 Myalgia, unspecified site: Secondary | ICD-10-CM | POA: Insufficient documentation

## 2018-01-05 DIAGNOSIS — R030 Elevated blood-pressure reading, without diagnosis of hypertension: Secondary | ICD-10-CM | POA: Insufficient documentation

## 2018-01-05 MED ORDER — CYCLOBENZAPRINE HCL 10 MG PO TABS: 10.0000 mg | ORAL_TABLET | Freq: Three times a day (TID) | ORAL | 1 refills | Status: AC | PRN
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
## 2018-01-05 NOTE — Patient Instructions (Signed)
Cervical Radiculopathy  Cervical radiculopathy means that a nerve in the neck is pinched or bruised. This can cause pain or loss of feeling (numbness) that runs from your neck to your arm and fingers.  Follow these instructions at home:  Managing pain  ? Take over-the-counter and prescription medicines only as told by your doctor.  ? If directed, put ice on the injured or painful area.  ? Put ice in a plastic bag.  ? Place a towel between your skin and the bag.  ? Leave the ice on for 20 minutes, 2?3 times per day.  ? If ice does not help, you can try using heat. Take a warm shower or warm bath, or use a heat pack as told by your doctor.  ? You may try a gentle neck and shoulder massage.  Activity  ? Rest as needed. Follow instructions from your doctor about any activities to avoid.  ? Do exercises as told by your doctor or physical therapist.  General instructions  ? If you were given a soft collar, wear it as told by your doctor.  ? Use a flat pillow when you sleep.  ? Keep all follow-up visits as told by your doctor. This is important.  Contact a doctor if:  ? Your condition does not improve with treatment.  Get help right away if:  ? Your pain gets worse and is not controlled with medicine.  ? You lose feeling or feel weak in your hand, arm, face, or leg.  ? You have a fever.  ? You have a stiff neck.  ? You cannot control when you poop or pee (have incontinence).  ? You have trouble with walking, balance, or talking.  This information is not intended to replace advice given to you by your health care provider. Make sure you discuss any questions you have with your health care provider.  Document Released: 05/28/2011 Document Revised: 11/14/2015 Document Reviewed: 08/02/2014  Elsevier Interactive Patient Education ? 2018 [employer]

## 2018-01-08 ENCOUNTER — Encounter: Payer: Self-pay | Admitting: Nurse Practitioner

## 2018-01-10 ENCOUNTER — Telehealth: Payer: Self-pay | Admitting: Endocrinology

## 2018-01-10 ENCOUNTER — Ambulatory Visit: Payer: Self-pay | Admitting: Endocrinology

## 2018-01-10 NOTE — Telephone Encounter (Signed)
Pt arrived for his appt at 4 pm today. He was asked to pay the self pay amount he immediately stated he was not paying us anything and walked out without an explanation or attempting to come to an understanding with Asher MuirJamie.

## 2018-01-10 NOTE — Telephone Encounter (Signed)
Forwarding information to PCP.

## 2018-01-11 ENCOUNTER — Other Ambulatory Visit: Payer: Self-pay

## 2018-01-24 ENCOUNTER — Ambulatory Visit (INDEPENDENT_AMBULATORY_CARE_PROVIDER_SITE_OTHER): Payer: Self-pay | Admitting: Orthopedic Surgery

## 2018-02-08 ENCOUNTER — Ambulatory Visit: Payer: Self-pay | Admitting: Nurse Practitioner
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
